# Patient Record
Sex: Female | Born: 1990 | Race: White | Hispanic: No | Marital: Single | Smoking: Never smoker
Health system: Southern US, Community
[De-identification: ages and names within clinical notes are randomized; demographics above are authoritative.]

## PROBLEM LIST (undated history)

## (undated) DIAGNOSIS — G039 Meningitis, unspecified: Secondary | ICD-10-CM

## (undated) DIAGNOSIS — R87629 Unspecified abnormal cytological findings in specimens from vagina: Secondary | ICD-10-CM

## (undated) DIAGNOSIS — I10 Essential (primary) hypertension: Secondary | ICD-10-CM

## (undated) DIAGNOSIS — O139 Gestational [pregnancy-induced] hypertension without significant proteinuria, unspecified trimester: Secondary | ICD-10-CM

## (undated) DIAGNOSIS — F32A Depression, unspecified: Secondary | ICD-10-CM

## (undated) DIAGNOSIS — F329 Major depressive disorder, single episode, unspecified: Secondary | ICD-10-CM

## (undated) HISTORY — PX: HIP SURGERY: SHX245

---
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
## 2014-10-20 ENCOUNTER — Inpatient Hospital Stay
Admit: 2014-10-20 | Discharge: 2014-10-20 | Disposition: A | Discharge status: Home / Self Care | Payer: Self-pay | Attending: Emergency Medicine

## 2014-10-20 DIAGNOSIS — N3000 Acute cystitis without hematuria: Secondary | ICD-10-CM

## 2014-10-20 LAB — URINALYSIS W/ RFLX MICROSCOPIC
Bilirubin: NEGATIVE
Glucose: NEGATIVE mg/dL
Ketone: NEGATIVE mg/dL
Nitrites: NEGATIVE
Protein: NEGATIVE mg/dL
Specific gravity: 1.02 (ref 1.003–1.030)
Urobilinogen: 0.2 EU/dL (ref 0.2–1.0)
pH (UA): 8 (ref 5.0–8.0)

## 2014-10-20 LAB — URINE MICROSCOPIC ONLY
RBC: NEGATIVE /hpf (ref 0–5)
WBC: 32 /hpf (ref 0–4)

## 2014-10-20 LAB — WET PREP

## 2014-10-20 LAB — HCG URINE, QL: HCG urine, QL: NEGATIVE

## 2014-10-20 MED ORDER — AZITHROMYCIN 250 MG TAB
250 mg | ORAL | Status: AC
Start: 2014-10-20 — End: 2014-10-20
  Administered 2014-10-20: 17:00:00 via ORAL

## 2014-10-20 MED ORDER — TRIMETHOPRIM-SULFAMETHOXAZOLE 160 MG-800 MG TAB
160-800 mg | ORAL_TABLET | Freq: Two times a day (BID) | ORAL | Status: AC
Start: 2014-10-20 — End: 2014-10-27

## 2014-10-20 MED ORDER — CEFTRIAXONE 250 MG SOLUTION FOR INJECTION
250 mg | INTRAMUSCULAR | Status: AC
Start: 2014-10-20 — End: 2014-10-20
  Administered 2014-10-20: 17:00:00 via INTRAMUSCULAR

## 2014-10-20 MED FILL — AZITHROMYCIN 250 MG TAB: 250 mg | ORAL | Qty: 4

## 2014-10-20 MED FILL — CEFTRIAXONE 250 MG SOLUTION FOR INJECTION: 250 mg | INTRAMUSCULAR | Qty: 250

## 2014-10-20 NOTE — ED Notes (Signed)
I performed a brief evaluation, including history and physical, of the patient here in triage and I have determined that pt will need further treatment and evaluation   I have placed initial orders to help in expediting patients care.     October 20, 2014 at 11:46 AM - Oneal Biglow, PA        There were no vitals taken for this visit.       Sore throat x 1 month, vomiting and diarrhea x this morning, vaginal discharge, pelvic pain, dysuria and urinary freq x 1 week

## 2014-10-20 NOTE — ED Provider Notes (Signed)
HPI Comments: 23 yo F c/o sore throat, vaginal discharge, and dysuria. Has had sore throat x 1 month, but 3 days ago noticed some white patches on her tonsils. She also c/o dysuria and vaginal discharge x 3 weeks. Denies fever, abdominal pain, n/v, cough, back pain. No other complaints.     Patient is a 23 y.o. female presenting with abdominal pain, sore throat, frequency, and other event.   Abdominal Pain   Associated symptoms include frequency.   Sore Throat     Urinary Frequency   Associated symptoms include frequency and abdominal pain.   Other  Associated symptoms include abdominal pain.                                    No past medical history on file.;     No past surgical history on file.      No family history on file.     History     Social History   ??? Marital Status: SINGLE     Spouse Name: N/A     Number of Children: N/A   ??? Years of Education: N/A     Occupational History   ??? Not on file.     Social History Main Topics   ??? Smoking status: Not on file   ??? Smokeless tobacco: Not on file   ??? Alcohol Use: Not on file   ??? Drug Use: Not on file   ??? Sexual Activity: Not on file     Other Topics Concern   ??? Not on file     Social History Narrative   ??? No narrative on file                  ALLERGIES: Review of patient's allergies indicates no known allergies.      Review of Systems   HENT: Positive for sore throat.    Gastrointestinal: Positive for abdominal pain.   Genitourinary: Positive for frequency.   All other systems reviewed and are negative.      Filed Vitals:    10/20/14 1152   BP: 154/96   Pulse: 93   Temp: 98.7 ??F (37.1 ??C)   Resp: 18   Height: 5\' 9"  (1.753 m)   Weight: 157.852 kg (348 lb)   SpO2: 98%            Physical Exam   Constitutional: She is oriented to person, place, and time. She appears well-developed and well-nourished. No distress.   Cardiovascular: Normal rate, regular rhythm and normal heart sounds.    Pulmonary/Chest: Effort normal and breath sounds normal. No respiratory  distress.   Abdominal: Soft. Normal appearance. There is tenderness in the suprapubic area.   Genitourinary: Uterus normal. There is no tenderness or lesion on the right labia. There is no tenderness or lesion on the left labia. Cervix exhibits no motion tenderness, no discharge and no friability. Right adnexum displays no tenderness and no fullness. Left adnexum displays no tenderness and no fullness. There is bleeding (mild) in the vagina. No vaginal discharge found.   RN present for exam   Musculoskeletal: Normal range of motion.   Neurological: She is alert and oriented to person, place, and time.   Skin: Skin is warm and dry.   Psychiatric: She has a normal mood and affect. Her behavior is normal.   Nursing note and vitals reviewed.  MDM  Number of Diagnoses or Management Options  Acute cystitis without hematuria: new and does not require workup  Screening examination for STD (sexually transmitted disease): new and does not require workup  Diagnosis management comments: Labs Reviewed  URINALYSIS W/ RFLX MICROSCOPIC - Abnormal; Notable for the following:      Blood                         TRACE (*)               Leukocyte Esterase            SMALL (*)            All other components within normal limits  URINE MICROSCOPIC ONLY - Abnormal; Notable for the following:      Bacteria                      1+ (*)              All other components within normal limits  CHLAMYDIA/GC AMPLIFIED  WET PREP  STREP THROAT SCREEN  HCG URINE, QL    Pt well appearing and in NAD. UA as above, will treat for UTI. Wet prep and Strep throat negative. GC/chlam pending. Pt would like abx ppx. Will d/h to f/u with PCP as needed. 1:18 PM        Amount and/or Complexity of Data Reviewed  Clinical lab tests: ordered and reviewed                            Procedures      -------------------------------------------------------------------------------------------------------------------     EKG INTERPRETATIONS:      RADIOLOGY RESULTS:    No orders to display       LABORATORY RESULTS:  Recent Results (from the past 12 hour(s))   URINALYSIS W/ RFLX MICROSCOPIC    Collection Time: 10/20/14 12:00 PM   Result Value Ref Range    Color YELLOW      Appearance CLEAR      Specific gravity 1.020 1.003 - 1.030      pH (UA) 8.0 5.0 - 8.0      Protein NEGATIVE  NEG mg/dL    Glucose NEGATIVE  NEG mg/dL    Ketone NEGATIVE  NEG mg/dL    Bilirubin NEGATIVE  NEG      Blood TRACE (A) NEG      Urobilinogen 0.2 0.2 - 1.0 EU/dL    Nitrites NEGATIVE  NEG      Leukocyte Esterase SMALL (A) NEG     HCG URINE, QL    Collection Time: 10/20/14 12:00 PM   Result Value Ref Range    HCG urine, Ql. NEGATIVE  NEG     URINE MICROSCOPIC ONLY    Collection Time: 10/20/14 12:00 PM   Result Value Ref Range    WBC 32 to 35 0 - 4 /hpf    RBC NEGATIVE  0 - 5 /hpf    Epithelial cells 2+ 0 - 5 /lpf    Bacteria 1+ (A) NEG /hpf   STREP THROAT SCREEN    Collection Time: 10/20/14 12:20 PM   Result Value Ref Range    Special Requests: NO SPECIAL REQUESTS      Strep Screen NEGATIVE       Culture result: PENDING    WET PREP    Collection Time: 10/20/14 12:30 PM  Result Value Ref Range    Special Requests: NO SPECIAL REQUESTS      Wet prep NO YEAST,TRICHOMONAS OR CLUE CELLS NOTED             CONSULTATIONS:        PROGRESS NOTES:        Lengthy D/W pt regarding possible worsening of pt's condition, need for follow up and strict ED return instructions for any worsening symptoms.     DISPOSITION:  ED DIAGNOSIS & DISPOSITION INFORMATION  Diagnosis:   1. Acute cystitis without hematuria    2. Screening examination for STD (sexually transmitted disease)          Disposition: home    Follow-up Information     Follow up With Details Comments Contact Info    [facility]  As needed [address]  [place] [id number]  [phone]    [facility]  Immediately if symptoms worsen [address]  [place] [id number]  [phone]           Current Discharge Medication List      START taking these medications    Details   trimethoprim-sulfamethoxazole (BACTRIM DS) 160-800 mg per tablet Take 1 Tab by mouth two (2) times a day for 7 days.  Qty: 14 Tab, Refills: 0

## 2014-10-20 NOTE — ED Notes (Signed)
Pt. C/o  Sore throat for  A month,   Lower abdominal pain, urinary frequency, vaginal discharge  For 3 weeks,   On & off diarrhea   For a month , nausea /vomiting today

## 2014-10-22 LAB — STREP THROAT SCREEN: Strep Screen: NEGATIVE

## 2014-10-23 LAB — CHLAMYDIA/GC PCR
Chlamydia amplified: NEGATIVE
N. gonorrhea, amplified: NEGATIVE

## 2015-04-17 NOTE — Progress Notes (Signed)
04/17/15:  Patient has been scheduled for Visit #1 on May 3 at 3:15 pm.    Adela PortsJacqueline Browning, MS RD

## 2015-04-24 ENCOUNTER — Inpatient Hospital Stay: Payer: Self-pay

## 2015-04-24 NOTE — Progress Notes (Signed)
04/24/15:  Patient did not show for her nutrition appointment.    Adela PortsJacqueline Browning, MS RD

## 2015-10-30 ENCOUNTER — Emergency Department: Admit: 2015-10-30 | Payer: PRIVATE HEALTH INSURANCE

## 2015-10-30 ENCOUNTER — Inpatient Hospital Stay
Admit: 2015-10-30 | Discharge: 2015-10-30 | Disposition: A | Discharge status: Home / Self Care | Payer: PRIVATE HEALTH INSURANCE | Attending: Emergency Medicine

## 2015-10-30 DIAGNOSIS — J069 Acute upper respiratory infection, unspecified: Secondary | ICD-10-CM

## 2015-10-30 MED ORDER — PREDNISONE 20 MG TAB
20 mg | ORAL_TABLET | Freq: Every day | ORAL | 0 refills | Status: AC
Start: 2015-10-30 — End: 2015-11-03

## 2015-10-30 MED ORDER — CODEINE-GUAIFENESIN 10 MG-100 MG/5 ML ORAL LIQUID
100-10 mg/5 mL | Freq: Four times a day (QID) | ORAL | 0 refills | Status: AC | PRN
Start: 2015-10-30 — End: ?

## 2015-10-30 MED ORDER — ALBUTEROL SULFATE HFA 90 MCG/ACTUATION AEROSOL INHALER
90 mcg/actuation | RESPIRATORY_TRACT | 0 refills | Status: AC | PRN
Start: 2015-10-30 — End: ?

## 2015-10-30 NOTE — ED Triage Notes (Signed)
Pt c/o cough and chest congestion on and off  x 1 month

## 2015-10-30 NOTE — ED Notes (Signed)
I have reviewed discharge instructions with the patient.  The patient verbalized understanding.    Medication teaching given, to include name, dose, action, and side effects. Patient verbalized understanding of medications. Encouraged patient to voice any concerns with reassurance provided.  Instructed not to drive or use heavy machinery while taking this medication. Patient states they have someone to drive them home.     Patient armband removed and shredded    Patient Discharged in stable condition.  Patient is awake, alert and oriented x 4.

## 2015-10-30 NOTE — ED Provider Notes (Signed)
HPI Comments: Pt c/o cough, off and on for one month, w occas pain under her breast, esp w coughing no sob. No abd pain. No vomiting no fever.  No weakness. No sore throat.  No urinary sx's or chance of curr preg.     Patient is a 24 y.o. female presenting with cough.   Cough   Associated symptoms include wheezing. Pertinent negatives include no chest pain and no vomiting.        History reviewed. No pertinent past medical history.    History reviewed. No pertinent past surgical history.      History reviewed. No pertinent family history.    Social History     Social History   ??? Marital status: SINGLE     Spouse name: N/A   ??? Number of children: N/A   ??? Years of education: N/A     Occupational History   ??? Not on file.     Social History Main Topics   ??? Smoking status: Not on file   ??? Smokeless tobacco: Not on file   ??? Alcohol use Not on file   ??? Drug use: Not on file   ??? Sexual activity: Not on file     Other Topics Concern   ??? Not on file     Social History Narrative   ??? No narrative on file         ALLERGIES: Review of patient's allergies indicates no known allergies.    Review of Systems   Constitutional: Negative for fever.   HENT: Negative for congestion.    Respiratory: Positive for cough and wheezing.    Cardiovascular: Negative for chest pain.   Gastrointestinal: Negative for abdominal pain and vomiting.   Musculoskeletal: Negative for back pain.   Skin: Negative for rash.   Neurological: Negative for light-headedness.   All other systems reviewed and are negative.      Vitals:    10/30/15 0031 10/30/15 0208   BP:  108/57   Resp: 21    Temp: 98.4 ??F (36.9 ??C)    SpO2: 98% 99%   Weight: 156.5 kg (345 lb)    Height:  (1.753 m)             Physical Exam   Constitutional: She is oriented to person, place, and time. She appears well-developed.   HENT:   Head: Normocephalic.   Mouth/Throat: Oropharynx is clear and moist.   Eyes: Pupils are equal, round, and reactive to light.   Neck: Normal range of motion.    Cardiovascular: Normal rate and normal heart sounds.    No murmur heard.  Pulmonary/Chest: Effort normal. She has wheezes (rare exp). She has no rales.   Abdominal: Soft. There is no tenderness.   Musculoskeletal: Normal range of motion. She exhibits no edema.   Neurological: She is alert and oriented to person, place, and time.   Skin: Skin is warm and dry.   Nursing note and vitals reviewed.       MDM  ED Course       Procedures        Vitals:  Patient Vitals for the past 12 hrs:   Temp Resp BP SpO2   10/30/15 0208 - - 108/57 99 %   10/30/15 0031 98.4 ??F (36.9 ??C) 21 - 98 %         Medications ordered:   Medications - No data to display      Lab findings:  No results  found for this or any previous visit (from the past 12 hour(s)).        X-Ray, CT or other radiology findings or impressions:  XR CHEST PA LAT   Final Result          Progress notes, Consult notes or additional Procedure notes:     2:09 AM pt refuses ekg as recommended, wants dc. + bronchospastic cough.  Af, nl vitals including nl sats. Neg cxr.  Stable for dc and close f/u.  Detailed return instr given.     Disposition:  Diagnosis:   1. Acute upper respiratory infection    2. Acute bronchospasm        Disposition: home    Follow-up Information     Follow up With Details Comments Contact Info    [facility] Schedule an appointment as soon as possible for a visit in 1 day  [address]  [address]  [place] [id number]  [phone]           Discharge Medication List as of 10/30/2015  2:05 AM      START taking these medications    Details   albuterol (PROVENTIL HFA, VENTOLIN HFA, PROAIR HFA) 90 mcg/actuation inhaler Take 1-2 Puffs by inhalation every four (4) hours as needed for Wheezing., Print, Disp-1 Inhaler, R-0      predniSONE (DELTASONE) 20 mg tablet Take 3 Tabs by mouth daily for 4 days., Print, Disp-12 Tab, R-0      guaiFENesin-codeine (ROBITUSSIN AC) 100-10 mg/5 mL solution Take 5-10 mL  by mouth every six (6) hours as needed for Cough. Max Daily Amount: 40 mL., Print, Disp-125 mL, R-0

## 2017-08-06 HISTORY — PX: COLPOSCOPY: SHX161

## 2017-09-07 LAB — OB RESULTS CONSOLE ABO/RH: RH TYPE: POSITIVE

## 2017-09-07 LAB — OB RESULTS CONSOLE ANTIBODY SCREEN: ANTIBODY SCREEN: NEGATIVE

## 2017-09-07 LAB — OB RESULTS CONSOLE GC/CHLAMYDIA
CHLAMYDIA, DNA PROBE: NEGATIVE
GC PROBE AMP, GENITAL: NEGATIVE

## 2017-09-07 LAB — OB RESULTS CONSOLE RUBELLA ANTIBODY, IGM: Rubella: IMMUNE

## 2017-09-07 LAB — OB RESULTS CONSOLE RPR: RPR: NONREACTIVE

## 2017-09-07 LAB — OB RESULTS CONSOLE HIV ANTIBODY (ROUTINE TESTING): HIV: NONREACTIVE

## 2017-09-07 LAB — OB RESULTS CONSOLE HEPATITIS B SURFACE ANTIGEN: HEP B S AG: NEGATIVE

## 2017-10-05 ENCOUNTER — Other Ambulatory Visit (HOSPITAL_COMMUNITY): Payer: Self-pay | Admitting: Obstetrics and Gynecology

## 2017-10-05 DIAGNOSIS — Z8279 Family history of other congenital malformations, deformations and chromosomal abnormalities: Secondary | ICD-10-CM

## 2017-10-05 DIAGNOSIS — Z3689 Encounter for other specified antenatal screening: Secondary | ICD-10-CM

## 2017-10-05 DIAGNOSIS — Z3A22 22 weeks gestation of pregnancy: Secondary | ICD-10-CM

## 2017-10-07 ENCOUNTER — Encounter (HOSPITAL_COMMUNITY): Payer: Self-pay | Admitting: Obstetrics and Gynecology

## 2017-10-13 ENCOUNTER — Encounter (HOSPITAL_COMMUNITY): Payer: Self-pay | Admitting: *Deleted

## 2017-10-14 ENCOUNTER — Ambulatory Visit (HOSPITAL_COMMUNITY)
Admission: RE | Admit: 2017-10-14 | Discharge: 2017-10-14 | Disposition: A | Discharge status: Home / Self Care | Payer: Medicaid Other | Source: Ambulatory Visit | Attending: Obstetrics and Gynecology | Admitting: Obstetrics and Gynecology

## 2017-10-14 ENCOUNTER — Encounter (HOSPITAL_COMMUNITY): Payer: Self-pay

## 2017-10-14 DIAGNOSIS — O99212 Obesity complicating pregnancy, second trimester: Secondary | ICD-10-CM | POA: Insufficient documentation

## 2017-10-14 DIAGNOSIS — Z368A Encounter for antenatal screening for other genetic defects: Secondary | ICD-10-CM | POA: Diagnosis not present

## 2017-10-14 DIAGNOSIS — Z363 Encounter for antenatal screening for malformations: Secondary | ICD-10-CM | POA: Diagnosis not present

## 2017-10-14 DIAGNOSIS — Z8279 Family history of other congenital malformations, deformations and chromosomal abnormalities: Secondary | ICD-10-CM | POA: Insufficient documentation

## 2017-10-14 DIAGNOSIS — Z3689 Encounter for other specified antenatal screening: Secondary | ICD-10-CM | POA: Diagnosis present

## 2017-10-14 DIAGNOSIS — Z3A22 22 weeks gestation of pregnancy: Secondary | ICD-10-CM | POA: Insufficient documentation

## 2017-10-14 DIAGNOSIS — Z6841 Body Mass Index (BMI) 40.0 and over, adult: Secondary | ICD-10-CM | POA: Insufficient documentation

## 2017-10-14 HISTORY — DX: Unspecified abnormal cytological findings in specimens from vagina: R87.629

## 2018-01-13 ENCOUNTER — Telehealth (HOSPITAL_COMMUNITY): Payer: Self-pay | Admitting: *Deleted

## 2018-01-13 ENCOUNTER — Other Ambulatory Visit: Payer: Self-pay | Admitting: Obstetrics and Gynecology

## 2018-01-13 ENCOUNTER — Encounter (HOSPITAL_COMMUNITY): Payer: Self-pay | Admitting: *Deleted

## 2018-01-13 NOTE — Telephone Encounter (Signed)
Preadmission screen  

## 2018-01-15 ENCOUNTER — Institutional Professional Consult (permissible substitution): Payer: Self-pay | Admitting: Pediatrics

## 2018-01-27 ENCOUNTER — Inpatient Hospital Stay (HOSPITAL_COMMUNITY)
Admission: RE | Admit: 2018-01-27 | Discharge: 2018-01-31 | DRG: 786 | Disposition: A | Discharge status: Home / Self Care | Payer: Medicaid Other | Source: Ambulatory Visit | Attending: Obstetrics and Gynecology | Admitting: Obstetrics and Gynecology

## 2018-01-27 ENCOUNTER — Encounter (HOSPITAL_COMMUNITY): Payer: Self-pay

## 2018-01-27 VITALS — BP 126/72 | HR 93 | Temp 98.3°F | Resp 18 | Ht 69.0 in | Wt 356.0 lb

## 2018-01-27 DIAGNOSIS — O134 Gestational [pregnancy-induced] hypertension without significant proteinuria, complicating childbirth: Principal | ICD-10-CM | POA: Diagnosis present

## 2018-01-27 DIAGNOSIS — O1490 Unspecified pre-eclampsia, unspecified trimester: Secondary | ICD-10-CM | POA: Diagnosis present

## 2018-01-27 DIAGNOSIS — O99214 Obesity complicating childbirth: Secondary | ICD-10-CM | POA: Diagnosis present

## 2018-01-27 DIAGNOSIS — Z3490 Encounter for supervision of normal pregnancy, unspecified, unspecified trimester: Secondary | ICD-10-CM

## 2018-01-27 DIAGNOSIS — O411230 Chorioamnionitis, third trimester, not applicable or unspecified: Secondary | ICD-10-CM | POA: Diagnosis present

## 2018-01-27 DIAGNOSIS — Z3A37 37 weeks gestation of pregnancy: Secondary | ICD-10-CM

## 2018-01-27 DIAGNOSIS — O34219 Maternal care for unspecified type scar from previous cesarean delivery: Secondary | ICD-10-CM

## 2018-01-27 HISTORY — DX: Depression, unspecified: F32.A

## 2018-01-27 HISTORY — DX: Gestational (pregnancy-induced) hypertension without significant proteinuria, unspecified trimester: O13.9

## 2018-01-27 HISTORY — DX: Meningitis, unspecified: G03.9

## 2018-01-27 HISTORY — DX: Major depressive disorder, single episode, unspecified: F32.9

## 2018-01-27 HISTORY — DX: Essential (primary) hypertension: I10

## 2018-01-27 LAB — COMPREHENSIVE METABOLIC PANEL
ALBUMIN: 2.6 g/dL — AB (ref 3.5–5.0)
ALT: 15 U/L (ref 14–54)
AST: 20 U/L (ref 15–41)
Alkaline Phosphatase: 109 U/L (ref 38–126)
Anion gap: 9 (ref 5–15)
BILIRUBIN TOTAL: 0.3 mg/dL (ref 0.3–1.2)
BUN: 10 mg/dL (ref 6–20)
CHLORIDE: 106 mmol/L (ref 101–111)
CO2: 17 mmol/L — ABNORMAL LOW (ref 22–32)
Calcium: 8.1 mg/dL — ABNORMAL LOW (ref 8.9–10.3)
Creatinine, Ser: 0.49 mg/dL (ref 0.44–1.00)
GFR calc Af Amer: >60 mL/min (ref >60)
GFR calc non Af Amer: >60 mL/min (ref >60)
GLUCOSE: 110 mg/dL — AB (ref 65–99)
POTASSIUM: 3.9 mmol/L (ref 3.5–5.1)
Sodium: 132 mmol/L — ABNORMAL LOW (ref 135–145)
Total Protein: 5.6 g/dL — ABNORMAL LOW (ref 6.5–8.1)

## 2018-01-27 LAB — CBC
HCT: 36.1 % (ref 36.0–46.0)
HEMATOCRIT: 35 % — AB (ref 36.0–46.0)
HEMOGLOBIN: 12.1 g/dL (ref 12.0–15.0)
Hemoglobin: 11.5 g/dL — ABNORMAL LOW (ref 12.0–15.0)
MCH: 27.9 pg (ref 26.0–34.0)
MCH: 28.5 pg (ref 26.0–34.0)
MCHC: 32.9 g/dL (ref 30.0–36.0)
MCHC: 33.5 g/dL (ref 30.0–36.0)
MCV: 85 fL (ref 78.0–100.0)
MCV: 85.1 fL (ref 78.0–100.0)
Platelets: 196 10*3/uL (ref 150–400)
Platelets: 198 10*3/uL (ref 150–400)
RBC: 4.12 MIL/uL (ref 3.87–5.11)
RBC: 4.24 MIL/uL (ref 3.87–5.11)
RDW: 13.3 % (ref 11.5–15.5)
RDW: 13.4 % (ref 11.5–15.5)
WBC: 8.1 10*3/uL (ref 4.0–10.5)
WBC: 12.2 10*3/uL — ABNORMAL HIGH (ref 4.0–10.5)

## 2018-01-27 LAB — TYPE AND SCREEN
ABO/RH(D): O POS
ANTIBODY SCREEN: NEGATIVE

## 2018-01-27 LAB — ABO/RH: ABO/RH(D): O POS

## 2018-01-27 LAB — OB RESULTS CONSOLE GBS: GBS: NEGATIVE

## 2018-01-27 MED ORDER — TERBUTALINE SULFATE 1 MG/ML IJ SOLN: 0.2500 mg | Freq: Once | INTRAMUSCULAR | Status: DC | PRN

## 2018-01-27 MED ORDER — LACTATED RINGERS IV SOLN
INTRAVENOUS | Status: DC
  Administered 2018-01-27: via INTRAVENOUS
  Administered 2018-01-27 (×2): 1000 mL via INTRAVENOUS
  Administered 2018-01-28: 04:00:00 via INTRAVENOUS
  Administered 2018-01-28: 125 mL/h via INTRAVENOUS

## 2018-01-27 MED ORDER — OXYTOCIN 40 UNITS IN LACTATED RINGERS INFUSION - SIMPLE MED: 2.5000 [IU]/h | INTRAVENOUS | Status: DC

## 2018-01-27 MED ORDER — SOD CITRATE-CITRIC ACID 500-334 MG/5ML PO SOLN
30.0000 mL | ORAL | Status: DC | PRN
  Administered 2018-01-28: 30 mL via ORAL
  Filled 2018-01-27: qty 15

## 2018-01-27 MED ORDER — LACTATED RINGERS IV SOLN: 500.0000 mL | INTRAVENOUS | Status: DC | PRN

## 2018-01-27 MED ORDER — OXYTOCIN 40 UNITS IN LACTATED RINGERS INFUSION - SIMPLE MED
1.0000 m[IU]/min | INTRAVENOUS | Status: DC
  Administered 2018-01-27 (×2): 2 m[IU]/min via INTRAVENOUS
  Filled 2018-01-27: qty 1000

## 2018-01-27 MED ORDER — OXYTOCIN BOLUS FROM INFUSION: 500.0000 mL | Freq: Once | INTRAVENOUS | Status: DC

## 2018-01-27 MED ORDER — ONDANSETRON HCL 4 MG/2ML IJ SOLN
4.0000 mg | Freq: Four times a day (QID) | INTRAMUSCULAR | Status: DC | PRN
  Administered 2018-01-27: 4 mg via INTRAVENOUS
  Filled 2018-01-27: qty 2

## 2018-01-27 MED ORDER — LIDOCAINE HCL (PF) 1 % IJ SOLN: 30.0000 mL | INTRAMUSCULAR | Status: DC | PRN

## 2018-01-27 MED ORDER — ACETAMINOPHEN 325 MG PO TABS: 650.0000 mg | ORAL_TABLET | ORAL | Status: DC | PRN

## 2018-01-27 NOTE — Progress Notes (Signed)
This note also relates to the following rows which could not be included: BP - Cannot attach notes to unvalidated device data Pulse Rate - Cannot attach notes to unvalidated device data  IFSE's not working  Unable to monitor adequately. Belly band used helps.

## 2018-01-27 NOTE — Anesthesia Pain Management Evaluation Note (Signed)
  CRNA Pain Management Visit Note  Patient: Anita Rojas, 27 y.o., female  "Hello I am a member of the anesthesia team [facility]. We have an anesthesia team available at all times to provide care throughout the hospital, including epidural management and anesthesia for C-section. I don't know your plan for the delivery whether it a natural birth, water birth, IV sedation, nitrous supplementation, doula or epidural, but we want to meet your pain goals."   1.Was your pain managed to your expectations on prior hospitalizations?   No prior hospitalizations  2.What is your expectation for pain management during this hospitalization?     Epidural and Nitrous Oxide  3.How can we help you reach that goal? Provide support  Record the patient's initial score and the patient's pain goal.   Pain: 1  Pain Goal: 5 [facility] wants you to be able to say your pain was always managed very well.  Sherrie GeorgeStephanie W [facility] 01/27/2018

## 2018-01-27 NOTE — H&P (Signed)
27 y.o. 3016w2d  G1P0 comes in for induction [facility].  Otherwise has good fetal movement and no bleeding.  Past Medical History:  Diagnosis Date  . Vaginal Pap smear, abnormal     Past Surgical History:  Procedure Laterality Date  . 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 COLPOSCOPY  08/06/2017  . HIP SURGERY      OB History  Gravida Para Term Preterm AB Living  1            SAB TAB Ectopic Multiple Live Births               # Outcome Date GA Lbr Len/2nd Weight Sex Delivery Anes PTL Lv  1 Current               Social History   Socioeconomic History  . Marital status: Single    Spouse name: Not on file  . Number of children: Not on file  . Years of education: Not on file  . Highest education level: Not on file  Social Needs  . Financial resource strain: Not on file  . Food insecurity - worry: Not on file  . Food insecurity - inability: Not on file  . Transportation needs - medical: Not on file  . Transportation needs - non-medical: Not on file  Occupational History  . Not on file  Tobacco Use  . Smoking status: Never Smoker  . Smokeless tobacco: Never Used  Substance and Sexual Activity  . Alcohol use: No  . Drug use: No  . Sexual activity: Not on file  Other Topics Concern  . Not on file  Social History Narrative  . Not on file   Patient has no known allergies.    Prenatal Transfer Tool  Maternal Diabetes: No Genetic Screening: Normal Maternal Ultrasounds/Referrals: Normal Fetal Ultrasounds or other Referrals:  None Maternal Substance Abuse:  No Significant Maternal Medications:  None Significant Maternal Lab Results: None  Other PNC: uncomplicated.    There were no vitals filed for this visit.  Lungs/Cor:  NAD Abdomen:  soft, gravid Ex:  no cords, erythema SVE:  3/50/-2,AROM clear FHTs:  120s, good STV, NST R Toco:  q occ   A/P   GHTN term induction.  GBS neg.  Dionna Wiedemann A

## 2018-01-28 ENCOUNTER — Encounter (HOSPITAL_COMMUNITY)
Admission: RE | Disposition: A | Discharge status: Home / Self Care | Payer: Self-pay | Source: Ambulatory Visit | Attending: Obstetrics and Gynecology

## 2018-01-28 ENCOUNTER — Encounter (HOSPITAL_COMMUNITY): Payer: Self-pay

## 2018-01-28 ENCOUNTER — Inpatient Hospital Stay (HOSPITAL_COMMUNITY): Payer: Medicaid Other | Admitting: Anesthesiology

## 2018-01-28 DIAGNOSIS — Z3490 Encounter for supervision of normal pregnancy, unspecified, unspecified trimester: Secondary | ICD-10-CM

## 2018-01-28 LAB — CBC
HCT: 33.5 % — ABNORMAL LOW (ref 36.0–46.0)
Hemoglobin: 11.2 g/dL — ABNORMAL LOW (ref 12.0–15.0)
MCH: 28.2 pg (ref 26.0–34.0)
MCHC: 33.4 g/dL (ref 30.0–36.0)
MCV: 84.4 fL (ref 78.0–100.0)
Platelets: 169 10*3/uL (ref 150–400)
RBC: 3.97 MIL/uL (ref 3.87–5.11)
RDW: 13 % (ref 11.5–15.5)
WBC: 16.4 10*3/uL — ABNORMAL HIGH (ref 4.0–10.5)

## 2018-01-28 LAB — SYPHILIS: RPR W/REFLEX TO RPR TITER AND TREPONEMAL ANTIBODIES, TRADITIONAL SCREENING AND DIAGNOSIS ALGORITHM: RPR Ser Ql: NONREACTIVE

## 2018-01-28 SURGERY — Surgical Case
Anesthesia: Epidural | Wound class: Clean Contaminated

## 2018-01-28 MED ORDER — SODIUM CHLORIDE 0.9 % IV SOLN
2.0000 g | Freq: Once | INTRAVENOUS | Status: AC
  Administered 2018-01-28: 2 g via INTRAVENOUS
  Filled 2018-01-28: qty 2000

## 2018-01-28 MED ORDER — OXYTOCIN 40 UNITS IN LACTATED RINGERS INFUSION - SIMPLE MED: 2.5000 [IU]/h | INTRAVENOUS | Status: AC

## 2018-01-28 MED ORDER — METOCLOPRAMIDE HCL 5 MG/ML IJ SOLN
INTRAMUSCULAR | Status: AC
  Filled 2018-01-28: qty 2

## 2018-01-28 MED ORDER — SCOPOLAMINE 1 MG/3DAYS TD PT72: 1.0000 | MEDICATED_PATCH | Freq: Once | TRANSDERMAL | Status: DC

## 2018-01-28 MED ORDER — DEXAMETHASONE SODIUM PHOSPHATE 4 MG/ML IJ SOLN
INTRAMUSCULAR | Status: AC
  Filled 2018-01-28: qty 1

## 2018-01-28 MED ORDER — MEPERIDINE HCL 25 MG/ML IJ SOLN: 6.2500 mg | INTRAMUSCULAR | Status: DC | PRN

## 2018-01-28 MED ORDER — PHENYLEPHRINE 40 MCG/ML (10ML) SYRINGE FOR IV PUSH (FOR BLOOD PRESSURE SUPPORT): 80.0000 ug | PREFILLED_SYRINGE | INTRAVENOUS | Status: DC | PRN

## 2018-01-28 MED ORDER — LIDOCAINE-EPINEPHRINE (PF) 2 %-1:200000 IJ SOLN
INTRAMUSCULAR | Status: AC
Start: 2018-01-28 — End: 2018-01-28
  Filled 2018-01-28: qty 20

## 2018-01-28 MED ORDER — LACTATED RINGERS IV SOLN
INTRAVENOUS | Status: DC | PRN
  Administered 2018-01-28 (×3): via INTRAVENOUS

## 2018-01-28 MED ORDER — WITCH HAZEL-GLYCERIN EX PADS: 1.0000 "application " | MEDICATED_PAD | CUTANEOUS | Status: DC | PRN

## 2018-01-28 MED ORDER — ZOLPIDEM TARTRATE 5 MG PO TABS: 5.0000 mg | ORAL_TABLET | Freq: Every evening | ORAL | Status: DC | PRN

## 2018-01-28 MED ORDER — IBUPROFEN 600 MG PO TABS
600.0000 mg | ORAL_TABLET | Freq: Four times a day (QID) | ORAL | Status: DC
  Administered 2018-01-28 – 2018-01-31 (×11): 600 mg via ORAL
  Filled 2018-01-28 (×12): qty 1

## 2018-01-28 MED ORDER — DEXAMETHASONE SODIUM PHOSPHATE 4 MG/ML IJ SOLN
INTRAMUSCULAR | Status: DC | PRN
  Administered 2018-01-28: 4 mg via INTRAVENOUS

## 2018-01-28 MED ORDER — NALBUPHINE HCL 10 MG/ML IJ SOLN: 5.0000 mg | INTRAMUSCULAR | Status: DC | PRN

## 2018-01-28 MED ORDER — DEXTROSE 5 % IV SOLN
INTRAVENOUS | Status: DC | PRN
  Administered 2018-01-28: 3 g via INTRAVENOUS

## 2018-01-28 MED ORDER — SCOPOLAMINE 1 MG/3DAYS TD PT72
MEDICATED_PATCH | TRANSDERMAL | Status: AC
  Filled 2018-01-28: qty 1

## 2018-01-28 MED ORDER — PHENYLEPHRINE 40 MCG/ML (10ML) SYRINGE FOR IV PUSH (FOR BLOOD PRESSURE SUPPORT)
PREFILLED_SYRINGE | INTRAVENOUS | Status: AC
  Filled 2018-01-28: qty 10

## 2018-01-28 MED ORDER — DIBUCAINE 1 % RE OINT: 1.0000 "application " | TOPICAL_OINTMENT | RECTAL | Status: DC | PRN

## 2018-01-28 MED ORDER — FENTANYL CITRATE (PF) 100 MCG/2ML IJ SOLN: 25.0000 ug | INTRAMUSCULAR | Status: DC | PRN

## 2018-01-28 MED ORDER — PHENYLEPHRINE 40 MCG/ML (10ML) SYRINGE FOR IV PUSH (FOR BLOOD PRESSURE SUPPORT)
80.0000 ug | PREFILLED_SYRINGE | INTRAVENOUS | Status: DC | PRN
  Filled 2018-01-28: qty 10

## 2018-01-28 MED ORDER — PHENYLEPHRINE HCL 10 MG/ML IJ SOLN
INTRAMUSCULAR | Status: DC | PRN
  Administered 2018-01-28: 40 ug via INTRAVENOUS
  Administered 2018-01-28: 80 ug via INTRAVENOUS

## 2018-01-28 MED ORDER — SODIUM BICARBONATE 8.4 % IV SOLN
INTRAVENOUS | Status: AC
  Filled 2018-01-28: qty 50

## 2018-01-28 MED ORDER — MEASLES, MUMPS & RUBELLA VAC ~~LOC~~ INJ
0.5000 mL | INJECTION | Freq: Once | SUBCUTANEOUS | Status: DC
  Filled 2018-01-28: qty 0.5

## 2018-01-28 MED ORDER — ACETAMINOPHEN 325 MG PO TABS
650.0000 mg | ORAL_TABLET | ORAL | Status: DC | PRN
  Administered 2018-01-28 – 2018-01-29 (×2): 650 mg via ORAL
  Administered 2018-01-31: 325 mg via ORAL
  Filled 2018-01-28 (×4): qty 2

## 2018-01-28 MED ORDER — LIDOCAINE HCL (PF) 1 % IJ SOLN
INTRAMUSCULAR | Status: DC | PRN
  Administered 2018-01-28 (×2): 4 mL via EPIDURAL

## 2018-01-28 MED ORDER — EPHEDRINE 5 MG/ML INJ: 10.0000 mg | INTRAVENOUS | Status: DC | PRN

## 2018-01-28 MED ORDER — COCONUT OIL OIL
1.0000 "application " | TOPICAL_OIL | Status: DC | PRN
  Administered 2018-01-28: 1 via TOPICAL
  Filled 2018-01-28: qty 120

## 2018-01-28 MED ORDER — SIMETHICONE 80 MG PO CHEW
80.0000 mg | CHEWABLE_TABLET | ORAL | Status: DC
  Administered 2018-01-29 – 2018-01-30 (×2): 80 mg via ORAL
  Filled 2018-01-28 (×3): qty 1

## 2018-01-28 MED ORDER — DEXTROSE 5 % IV SOLN
1.0000 ug/kg/h | INTRAVENOUS | Status: DC | PRN
  Filled 2018-01-28: qty 5

## 2018-01-28 MED ORDER — LACTATED RINGERS IV SOLN
500.0000 mL | Freq: Once | INTRAVENOUS | Status: AC
  Administered 2018-01-28: 500 mL via INTRAVENOUS

## 2018-01-28 MED ORDER — ONDANSETRON HCL 4 MG/2ML IJ SOLN
INTRAMUSCULAR | Status: DC | PRN
  Administered 2018-01-28: 4 mg via INTRAVENOUS

## 2018-01-28 MED ORDER — SODIUM CHLORIDE 0.9% FLUSH: 3.0000 mL | INTRAVENOUS | Status: DC | PRN

## 2018-01-28 MED ORDER — NALBUPHINE HCL 10 MG/ML IJ SOLN: 5.0000 mg | Freq: Once | INTRAMUSCULAR | Status: DC | PRN

## 2018-01-28 MED ORDER — PRENATAL MULTIVITAMIN CH
1.0000 | ORAL_TABLET | Freq: Every day | ORAL | Status: DC
  Administered 2018-01-29 – 2018-01-30 (×2): 1 via ORAL
  Filled 2018-01-28 (×2): qty 1

## 2018-01-28 MED ORDER — METOCLOPRAMIDE HCL 5 MG/ML IJ SOLN
INTRAMUSCULAR | Status: DC | PRN
  Administered 2018-01-28: 10 mg via INTRAVENOUS

## 2018-01-28 MED ORDER — MORPHINE SULFATE (PF) 0.5 MG/ML IJ SOLN
INTRAMUSCULAR | Status: DC | PRN
  Administered 2018-01-28: 4 mg via EPIDURAL

## 2018-01-28 MED ORDER — LACTATED RINGERS IV SOLN
INTRAVENOUS | Status: DC | PRN
  Administered 2018-01-28: 12:00:00 via INTRAVENOUS

## 2018-01-28 MED ORDER — DEXTROSE 5 % IV SOLN
INTRAVENOUS | Status: AC
  Filled 2018-01-28: qty 3000

## 2018-01-28 MED ORDER — SENNOSIDES-DOCUSATE SODIUM 8.6-50 MG PO TABS
2.0000 | ORAL_TABLET | ORAL | Status: DC
  Administered 2018-01-29 – 2018-01-30 (×3): 2 via ORAL
  Filled 2018-01-28 (×3): qty 2

## 2018-01-28 MED ORDER — OXYTOCIN 10 UNIT/ML IJ SOLN
INTRAVENOUS | Status: DC | PRN
  Administered 2018-01-28: 40 [IU] via INTRAVENOUS

## 2018-01-28 MED ORDER — TETANUS-DIPHTH-ACELL PERTUSSIS 5-2.5-18.5 LF-MCG/0.5 IM SUSP: 0.5000 mL | Freq: Once | INTRAMUSCULAR | Status: DC

## 2018-01-28 MED ORDER — LACTATED RINGERS IV SOLN
INTRAVENOUS | Status: DC
  Administered 2018-01-28: 22:00:00 via INTRAVENOUS

## 2018-01-28 MED ORDER — OXYTOCIN 10 UNIT/ML IJ SOLN
INTRAMUSCULAR | Status: AC
  Filled 2018-01-28: qty 4

## 2018-01-28 MED ORDER — FENTANYL 2.5 MCG/ML BUPIVACAINE 1/10 % EPIDURAL INFUSION (WH - ANES)
14.0000 mL/h | INTRAMUSCULAR | Status: DC | PRN
  Administered 2018-01-28 (×2): 14 mL/h via EPIDURAL
  Filled 2018-01-28 (×2): qty 100

## 2018-01-28 MED ORDER — LIDOCAINE-EPINEPHRINE (PF) 2 %-1:200000 IJ SOLN
INTRAMUSCULAR | Status: AC
  Filled 2018-01-28: qty 40

## 2018-01-28 MED ORDER — MORPHINE SULFATE (PF) 0.5 MG/ML IJ SOLN
INTRAMUSCULAR | Status: AC
  Filled 2018-01-28: qty 10

## 2018-01-28 MED ORDER — NALOXONE HCL 0.4 MG/ML IJ SOLN: 0.4000 mg | INTRAMUSCULAR | Status: DC | PRN

## 2018-01-28 MED ORDER — DIPHENHYDRAMINE HCL 50 MG/ML IJ SOLN: 12.5000 mg | INTRAMUSCULAR | Status: DC | PRN

## 2018-01-28 MED ORDER — ONDANSETRON HCL 4 MG/2ML IJ SOLN
INTRAMUSCULAR | Status: AC
  Filled 2018-01-28: qty 2

## 2018-01-28 MED ORDER — SODIUM BICARBONATE 8.4 % IV SOLN
INTRAVENOUS | Status: DC | PRN
  Administered 2018-01-28: 5 mL via EPIDURAL
  Administered 2018-01-28: 8 mL via EPIDURAL
  Administered 2018-01-28: 2 mL via EPIDURAL
  Administered 2018-01-28: 5 mL via EPIDURAL
  Administered 2018-01-28 (×2): 3 mL via EPIDURAL
  Administered 2018-01-28: 4 mL via EPIDURAL

## 2018-01-28 MED ORDER — DIPHENHYDRAMINE HCL 25 MG PO CAPS
25.0000 mg | ORAL_CAPSULE | ORAL | Status: DC | PRN
  Filled 2018-01-28: qty 1

## 2018-01-28 MED ORDER — EPHEDRINE 5 MG/ML INJ
10.0000 mg | INTRAVENOUS | Status: DC | PRN
Start: 2018-01-28 — End: 2018-01-28

## 2018-01-28 MED ORDER — ONDANSETRON HCL 4 MG/2ML IJ SOLN: 4.0000 mg | Freq: Three times a day (TID) | INTRAMUSCULAR | Status: DC | PRN

## 2018-01-28 SURGICAL SUPPLY — 30 items
BENZOIN TINCTURE PRP APPL 2/3 (GAUZE/BANDAGES/DRESSINGS) ×6 IMPLANT
CHLORAPREP W/TINT 26ML (MISCELLANEOUS) ×3 IMPLANT
CLAMP CORD UMBIL (MISCELLANEOUS) IMPLANT
CLOSURE WOUND 1/2 X4 (GAUZE/BANDAGES/DRESSINGS) ×1
CLOTH BEACON ORANGE TIMEOUT ST (SAFETY) ×3 IMPLANT
DRESSING DISP NPWT PICO 4X12 (MISCELLANEOUS) ×3 IMPLANT
DRSG OPSITE POSTOP 4X10 (GAUZE/BANDAGES/DRESSINGS) ×3 IMPLANT
ELECT REM PT RETURN 9FT ADLT (ELECTROSURGICAL) ×3
ELECTRODE REM PT RTRN 9FT ADLT (ELECTROSURGICAL) ×1 IMPLANT
EXTRACTOR VACUUM M CUP 4 TUBE (SUCTIONS) IMPLANT
EXTRACTOR VACUUM M CUP 4' TUBE (SUCTIONS)
GLOVE BIOGEL PI IND STRL 7.0 (GLOVE) ×1 IMPLANT
GLOVE BIOGEL PI INDICATOR 7.0 (GLOVE) ×2
GLOVE ECLIPSE 7.0 STRL STRAW (GLOVE) ×6 IMPLANT
GOWN STRL REUS W/TWL LRG LVL3 (GOWN DISPOSABLE) ×6 IMPLANT
KIT ABG SYR 3ML LUER SLIP (SYRINGE) IMPLANT
NEEDLE HYPO 25X5/8 SAFETYGLIDE (NEEDLE) IMPLANT
NS IRRIG 1000ML POUR BTL (IV SOLUTION) ×3 IMPLANT
PACK C SECTION WH (CUSTOM PROCEDURE TRAY) ×3 IMPLANT
PAD OB MATERNITY 4.3X12.25 (PERSONAL CARE ITEMS) ×3 IMPLANT
RETAINER VISCERAL (MISCELLANEOUS) ×3 IMPLANT
RTRCTR C-SECT PINK 34CM XLRG (MISCELLANEOUS) ×3 IMPLANT
STRIP CLOSURE SKIN 1/2X4 (GAUZE/BANDAGES/DRESSINGS) ×2 IMPLANT
SUT MNCRL 0 VIOLET CTX 36 (SUTURE) ×5 IMPLANT
SUT MON AB 2-0 CT1 27 (SUTURE) ×6 IMPLANT
SUT MONOCRYL 0 CTX 36 (SUTURE) ×10
SUT PLAIN 0 NONE (SUTURE) IMPLANT
SUT PLAIN 2 0 XLH (SUTURE) ×6 IMPLANT
TOWEL OR 17X24 6PK STRL BLUE (TOWEL DISPOSABLE) ×3 IMPLANT
TRAY FOLEY BAG SILVER LF 14FR (SET/KITS/TRAYS/PACK) IMPLANT

## 2018-01-28 NOTE — Progress Notes (Signed)
Pt has made no change in her cx since 1900hours yesterday. She now has an elevated temp. Despite increasing the pitocin her MDUs continue to decline.Will proceed to the OR for c/s secondary to FTP. Discussed with pt.

## 2018-01-28 NOTE — Anesthesia Procedure Notes (Signed)
Epidural Patient location during procedure: OB Start time: 01/28/2018 1:39 AM  Staffing Anesthesiologist: Leonides GrillsEllender, Mollyann Halbert P, MD Performed: anesthesiologist   Preanesthetic Checklist Completed: patient identified, site marked, pre-op evaluation, timeout performed, IV checked, risks and benefits discussed and monitors and equipment checked  Epidural Patient position: sitting Prep: DuraPrep Patient monitoring: heart rate, cardiac monitor, continuous pulse ox and blood pressure Approach: midline Location: L3-L4 Injection technique: LOR air  Needle:  Needle type: Tuohy  Needle gauge: 17 G Needle length: 9 cm Needle insertion depth: 11 cm Catheter type: closed end flexible Catheter size: 19 Gauge Catheter at skin depth: 16 cm Test dose: negative and Other  Assessment Events: blood not aspirated, injection not painful, no injection resistance and negative IV test  Additional Notes Informed consent obtained prior to proceeding including risk of failure, 1% risk of PDPH, risk of minor discomfort and bruising. Discussed alternatives to epidural analgesia and patient desires to proceed.  Timeout performed pre-procedure verifying patient name, procedure, and platelet count.  Patient tolerated procedure well. Reason for block:procedure for pain

## 2018-01-28 NOTE — Progress Notes (Signed)
Pt comfortable with epidural.  BPs have been normal to low.  SVE now 6/C/-2 FHTs 120s, gstv, NST R, cat one tracing Toco q5 minutes   Second time cutting pitocin in half; MVUS were great and now have decreased a little but expect them to get adequate again with increasing dose.  Pt is making change.  Continue.

## 2018-01-28 NOTE — Anesthesia Preprocedure Evaluation (Signed)
Anesthesia Evaluation  Patient identified by MRN, date of birth, ID band Patient awake    Reviewed: Allergy & Precautions, H&P , NPO status , Patient's Chart, lab work & pertinent test results  History of Anesthesia Complications Negative for: history of anesthetic complications  Airway Mallampati: II  TM Distance: >3 FB Neck ROM: full    Dental no notable dental hx. (+) Teeth Intact   Pulmonary neg pulmonary ROS,    Pulmonary exam normal breath sounds clear to auscultation       Cardiovascular hypertension, Normal cardiovascular exam Rhythm:regular Rate:Normal     Neuro/Psych negative neurological ROS  negative psych ROS   GI/Hepatic negative GI ROS, Neg liver ROS,   Endo/Other  Morbid obesity  Renal/GU negative Renal ROS  negative genitourinary   Musculoskeletal   Abdominal (+) + obese,   Peds  Hematology negative hematology ROS (+)   Anesthesia Other Findings Super obese  Reproductive/Obstetrics (+) Pregnancy                             Anesthesia Physical Anesthesia Plan  ASA: IV  Anesthesia Plan: Epidural   Post-op Pain Management:    Induction:   PONV Risk Score and Plan:   Airway Management Planned:   Additional Equipment:   Intra-op Plan:   Post-operative Plan:   Informed Consent: I have reviewed the patients History and Physical, chart, labs and discussed the procedure including the risks, benefits and alternatives for the proposed anesthesia with the patient or authorized representative who has indicated his/her understanding and acceptance.     Plan Discussed with:   Anesthesia Plan Comments:         Anesthesia Quick Evaluation

## 2018-01-28 NOTE — Transfer of Care (Signed)
Immediate Anesthesia Transfer of Care Note  Patient: Anita Rojas  Procedure(s) Performed: CESAREAN SECTION (N/A )  Patient Location: PACU  Anesthesia Type:Epidural  Level of Consciousness: awake, alert  and oriented  Airway & Oxygen Therapy: Patient Spontanous Breathing  Post-op Assessment: Report given to RN and Post -op Vital signs reviewed and stable  Post vital signs: Reviewed and stable  Last Vitals:  Vitals:   01/28/18 1135 01/28/18 1302  BP: 126/77   Pulse: (!) 117 (!) 123  Resp:  16  Temp:  37.5 C  SpO2:  97%    Last Pain:  Vitals:   01/28/18 1043  TempSrc: Oral  PainSc: 0-No pain      Patients Stated Pain Goal: 1 (01/27/18 1824)  Complications: No apparent anesthesia complications

## 2018-01-29 LAB — CBC
HCT: 28.7 % — ABNORMAL LOW (ref 36.0–46.0)
Hemoglobin: 9.4 g/dL — ABNORMAL LOW (ref 12.0–15.0)
MCH: 28.1 pg (ref 26.0–34.0)
MCHC: 32.8 g/dL (ref 30.0–36.0)
MCV: 85.7 fL (ref 78.0–100.0)
PLATELETS: 151 10*3/uL (ref 150–400)
RBC: 3.35 MIL/uL — ABNORMAL LOW (ref 3.87–5.11)
RDW: 13.4 % (ref 11.5–15.5)
WBC: 15.2 10*3/uL — ABNORMAL HIGH (ref 4.0–10.5)

## 2018-01-29 MED ORDER — OXYCODONE-ACETAMINOPHEN 5-325 MG PO TABS
1.0000 | ORAL_TABLET | ORAL | Status: DC | PRN
  Administered 2018-01-29: 2 via ORAL
  Administered 2018-01-30 – 2018-01-31 (×6): 1 via ORAL
  Filled 2018-01-29: qty 1
  Filled 2018-01-29: qty 2
  Filled 2018-01-29 (×2): qty 1
  Filled 2018-01-29: qty 2
  Filled 2018-01-29 (×2): qty 1

## 2018-01-29 MED ORDER — SIMETHICONE 80 MG PO CHEW
80.0000 mg | CHEWABLE_TABLET | Freq: Four times a day (QID) | ORAL | Status: DC | PRN
  Administered 2018-01-29 – 2018-01-30 (×3): 80 mg via ORAL
  Filled 2018-01-29 (×2): qty 1

## 2018-01-29 NOTE — Progress Notes (Signed)
Late entry from morning rounds POD#1 Patient is eating, ambulating, voiding.  Pain was good in am, pt recently requested add'l pain medication, percocet was ordered.  Pt denied any other complaints.  Vitals:   01/29/18 0511 01/29/18 0900 01/29/18 1100 01/29/18 1903  BP: (!) 111/52 133/72  (!) 137/55  Pulse: 89 90 99 100  Resp: 18 18  18   Temp: 97.8 F (36.6 C) 97.7 F (36.5 C)  98 F (36.7 C)  TempSrc: Oral Oral  Oral  SpO2: 98% 95% 98%   Weight:      Height:       Inc: dressing with bloody drainage at lower aspect causing dressing to lift and impede seal of wound vac Ext: no CT  Lab Results  Component Value Date   WBC 15.2 (H) 01/29/2018   HGB 9.4 (L) 01/29/2018   HCT 28.7 (L) 01/29/2018   MCV 85.7 01/29/2018   PLT 151 01/29/2018    A/P Post op Day #1 s/p c/s for arrest of dilation and suspected chorioamnionitis BPs have been normal range since delivery Asked RN to change wound vac- this was done Pt had called nurse in requesting pain meds- please see note from RN from 20:26 I returned to patients room thereafter to determine the cause of her anger and frustration.  She said she was upset I told her she may stay til Monday and didn't understand why.  We discussed monitoring of BP and incision and making sure her pain was well controlled.  Advised that if all was stable she would not have to stay until Monday.  I apologized for the confusion and her not feeling well taken care of, at the conclusion of the conversation I asked if she was ok with me continuing to take care of her and she said that was fine, she felt better after our communication.  Philip AspenALLAHAN, Pati Thinnes

## 2018-01-29 NOTE — Lactation Note (Signed)
This note was copied from a baby's chart. Lactation Consultation Note  Patient Name: [name] ZOXWR'UToday's Date: 01/29/2018  Pecola LeisureBaby is now 2826 hours old.  Baby is not latching well and mom has been pumping every 3 hours.  She has obtained 9 mls x 2, 15 mls and 5 mls most recently.  Explained to mom that baby needs 15 mls every 3 hours.  Instructed to pump in 1 1/2 hours and if not enough volume is obtained to call out for formula.  Baby is currently sleeping.  Instructed to feed with cues and call out for latch assist prn.   Maternal Data    Feeding Feeding Type: Breast Milk  LATCH Score                   Interventions    Lactation Tools Discussed/Used     Consult Status      Huston FoleyMOULDEN, Renan Danese S 01/29/2018, 2:18 PM

## 2018-01-29 NOTE — Op Note (Signed)
NAME:  Anita Rojas, [name]              ACCOUNT NO.:  [id number]  MEDICAL RECORD NO.:  [id number]  LOCATION:                                 FACILITY:  PHYSICIAN:  Malva LimesMark Rodger Giangregorio, M.D.         DATE OF BIRTH:  DATE OF PROCEDURE:  01/28/2018 DATE OF DISCHARGE:                              OPERATIVE REPORT   PREOPERATIVE DIAGNOSES: 1. Intrauterine pregnancy at 3137 weeks' estimated gestational age. 2. Failure to progress.  PROCEDURE:  Primary low transverse cesarean section.  SURGEON:  Malva LimesMark Ima Hafner, M.D.  Threasa HeadsASSISTANChestine Spore:  Clark.  ANESTHESIA:  Epidural.  ANTIBIOTICS:  Ancef 3 g.  DRAINS:  Foley bedside drainage.  ESTIMATED BLOOD LOSS:  900 mL.  SPECIMENS:  None.  FINDINGS:  The patient had normal fallopian tubes and ovaries bilaterally.  Placenta appeared normal.  There was a nuchal cord x1.  DESCRIPTION OF PROCEDURE:  The patient was taken to the operating room, where her epidural anesthetic was reinjected.  She was then placed in a dorsal supine position with a left lateral tilt.  She was prepped and draped in the usual fashion for this procedure.  A Pfannenstiel incision was made.  This was carried down to the fascia.  The fascia was entered in the midline and extended laterally with blunt dissection.  The rectus muscles were dissected from the fascia.  Rectus muscles were divided in midline.  Parietal peritoneum was grasped, entered sharply, taken superiorly and inferiorly.  The bladder flap was then taken down sharply.  A low-transverse uterine incision was made in the midline with the Metzenbaum scissors and extended laterally with blunt dissection. Amniotic fluid was clear.  The infant was delivered in the vertex presentation with a vacuum extractor.  Nuchal cord was reduced.  The remaining infant was then delivered.  The cord was doubly clamped and cut and the infant handed to the awaiting NICU team.  Cord blood was not obtained.  Placenta was manually removed.  The uterus  was exteriorized. Uterine cavity was examined and wiped with a wet lap.  The incision of the uterus was closed using a single layer of 0 Monocryl suture in a running locking fashion.  The bladder flap was not closed.  The uterus was placed back in the abdominal cavity.  Hemostasis was checked and felt to be adequate.  The parietal peritoneum and rectus muscles were reapproximated in the midline using 2-0 Monocryl in a running fashion. The fascia was closed using 0 Monocryl suture in a running fashion.  Subcuticular tissue was irrigated, made hemostatic with the Bovie, and then closed with interrupted 2-0 plain gut sutures.  The skin was closed using 4-0 Rapide in a subcuticular fashion followed by Steri- Strips.  The patient was taken to the recovery room in stable condition. Instrument and lap count were correct x3.          ______________________________ Malva LimesMark Linford Quintela, M.D.     MA/MEDQ  D:  01/28/2018  T:  01/28/2018  Job:  [id number]

## 2018-01-29 NOTE — Progress Notes (Signed)
Rn went to go check and assess pt. Pt stated "can I get anything else im hurting". RN told patient there isnt anything else ordered and asked pain score. Pt stating pain is a 6. RN asked pt if she wanted me to contact the provider and see if anything else could be given. Pt stated "is that dr. Claiborne Billingsallahan" Rn told her that it would be who ever was on call. She said "fuck her I dont need her for anything". RN told patient that she would call the provider and she doesn't have to communicate with her at this time. She said no fuck her. PT support person/guest in room told her not to suffer because of her stupid doctor. RN told patient about pain control. Pt stated yeah you can call but I dont wanna see her again. RN told patient that she was not sure who was rounding in the morning but she would make a note in the computer. RN contacted AD Max SanePatti Phillips. Vear Clockhillips went into the room and offered Ice for incision pain. PT declined.   Dr. Dareen PianoAnderson notified at 2035 and voicemail provided.   Tierney Behl L Jacob Cicero, RN

## 2018-01-29 NOTE — Anesthesia Postprocedure Evaluation (Signed)
Anesthesia Post Note  Patient: Anita Rojas  Procedure(s) Performed: CESAREAN SECTION (N/A )     Patient location during evaluation: Mother Baby Anesthesia Type: Epidural Level of consciousness: awake and alert Pain management: pain level controlled Vital Signs Assessment: post-procedure vital signs reviewed and stable Respiratory status: spontaneous breathing, nonlabored ventilation and respiratory function stable Cardiovascular status: stable Postop Assessment: no headache, no backache and epidural receding Anesthetic complications: no    Last Vitals:  Vitals:   01/28/18 1800 01/29/18 0511  BP:  (!) 111/52  Pulse: (!) 115 89  Resp:  18  Temp:  36.6 C  SpO2: 95% 98%    Last Pain:  Vitals:   01/29/18 0511  TempSrc: Oral  PainSc:    Pain Goal: Patients Stated Pain Goal: 1 (01/27/18 1824)               Sherrie GeorgeStephanie W Wil Slape

## 2018-01-29 NOTE — Progress Notes (Signed)
At first 4-hr check (2110), new bleeding noted on patient's pico dressing; however, because her perineal pad had slipped, it appeared as if some of the bleeding had transferred from that.  Area marked. No new bleeding at 0111 check and patient has been up to bathroom, sitting in chair, breastfeeding.  Urine output is WNL (at least 40 ml/hr) and clear but orange in color.  Patient has been encouraged to drink (explained why that is at least as important as fluid from her IV).

## 2018-01-29 NOTE — Lactation Note (Addendum)
This note was copied from a baby's chart. Lactation Consultation Note Baby 13 hrs old. Having some difficulty latching. RN assisted in latching in cross cradle position.  Mom has tubular breast w/everted nipple at the bottom end of the nipple. Mom has some areola edema, reverse pressure demonstrated w/improvement.  Rolled cloth under breast for support.  Assisted in football position. Mom liked this position. Baby BF great w/good suck swallow coordination. Hand expressed 9 ml colostrum from Lt. Breast.  Discussed breast massage, newborn feeding habits, STS, I&O, and cluster feeding. Mom stated she prefers to pump and bottle feed colostrum. Mom stated she had gotten discouraged and not knowing if the baby was getting enough. Encouraged to assess breast before and after BF for transfer. Mom excited to feel the difference.  Mom shown how to use DEBP & how to disassemble, clean, & reassemble parts. Mom knows to pump q3h for 15-20 min. Answered a lot of questions mom had.  WH/LC brochure given w/resources, support groups and LC services.  Patient Name: Girl Trish Fountainaylor Bunning Today's Date: 01/29/2018 Reason for consult: Initial assessment   Maternal Data Has patient been taught Hand Expression?: Yes Does the patient have breastfeeding experience prior to this delivery?: No  Feeding Feeding Type: Breast Fed Length of feed: 45 min  LATCH Score Latch: Grasps breast easily, tongue down, lips flanged, rhythmical sucking.  Audible Swallowing: A few with stimulation  Type of Nipple: Everted at rest and after stimulation  Comfort (Breast/Nipple): Soft / non-tender  Hold (Positioning): Assistance needed to correctly position infant at breast and maintain latch.  LATCH Score: 8  Interventions Interventions: Breast feeding basics reviewed;Assisted with latch;Breast compression;Reverse pressure;Skin to skin;Adjust position;Breast massage;Support pillows;Hand express;Position options;DEBP;Expressed  milk  Lactation Tools Discussed/Used Tools: Pump Breast pump type: Double-Electric Breast Pump WIC Program: No Pump Review: Setup, frequency, and cleaning;Milk Storage Initiated by:: Peri JeffersonL. Sanam Marmo RN IBCLC Date initiated:: 01/29/18   Consult Status Consult Status: Follow-up Date: 01/30/18 Follow-up type: In-patient    Demarrius Guerrero, Diamond NickelLAURA G 01/29/2018, 3:08 AM

## 2018-01-30 MED ORDER — OXYCODONE-ACETAMINOPHEN 5-325 MG PO TABS
1.0000 | ORAL_TABLET | ORAL | 0 refills | Status: AC | PRN
Start: 2018-01-30 — End: ?

## 2018-01-30 NOTE — Progress Notes (Signed)
POD#2 Patient is eating, ambulating, voiding and passing flatus.  Pain control is good.  Bleeding is minimal.  Vitals:   01/29/18 0511 01/29/18 0900 01/29/18 1100 01/29/18 1903  BP: (!) 111/52 133/72  (!) 137/55  Pulse: 89 90 99 100  Resp: 18 18  18   Temp: 97.8 F (36.6 C) 97.7 F (36.5 C)  98 F (36.7 C)  TempSrc: Oral Oral  Oral  SpO2: 98% 95% 98%   Weight:      Height:       Inc: vac dressing again peeling away from skin, no bloody drainage Ext: +edema, no claf tenderness  Lab Results  Component Value Date   WBC 15.2 (H) 01/29/2018   HGB 9.4 (L) 01/29/2018   HCT 28.7 (L) 01/29/2018   MCV 85.7 01/29/2018   PLT 151 01/29/2018     A/P Post op day #2 s/p c/s Dressing to be reapplied and partner requesting to use this opportunity to be instructed again in application at home BPs normal Pt without complaints and doing much better today  Routine care.  Expect d/c 2/10.    Philip AspenALLAHAN, Aashka Salomone

## 2018-01-30 NOTE — Clinical Social Work Maternal (Signed)
  CLINICAL SOCIAL WORK MATERNAL/CHILD NOTE  Patient Details  Name: Anita Rojas MRN: [id number] Date of Birth: Dec 09, 1991  Date:  01/30/2018  Clinical Social Worker Initiating Note:  Trisha Mangle Date/Time: Initiated:  01/30/18/      Child's Name:  Anita Rojas   Biological Parents:  Mother, Father   Need for Interpreter:  None   Reason for Referral:  Other (Comment), Current Substance Use/Substance Use During Pregnancy (consulted for history of anxiety & depression)   Address:  [address], [address] [place] [address]    Phone number:  [phone] (home)     Additional phone number:   Household Members/Support Persons (HM/SP):   Household Member/Support Person 1   HM/SP Name Relationship DOB or Age  HM/SP -1 EVAN HAILES significant other    HM/SP -2        HM/SP -3        HM/SP -4        HM/SP -5        HM/SP -6        HM/SP -7        HM/SP -8          Natural Supports (not living in the home):  Immediate Family, Friends, Extended Family   Professional Supports: None   Employment: Unemployed   Type of Work:     Education:      Homebound arranged:    Museum/gallery curator Resources:  Medicaid   Other Resources:      Cultural/Religious Considerations Which May Impact Care:    Strengths:  Ability to meet basic needs , Home prepared for child    Psychotropic Medications:         Pediatrician:       Pediatrician List:   [place]      Pediatrician Fax Number:    Risk Factors/Current Problems:  None   Cognitive State:  Alert    Mood/Affect:  Animated, Bright    CSW Assessment: LCSW consulted for history of anxiety and depression.  Chart review reflected no concerns noted in MOB's prenatal visits although a review of MOB's labs through [facility] revealed a positive test for [facility] and Barbiturates on 12/25/17 so LCSW requested cord be sent out and LCSW will  follow.  LCSW met with MOB and FOB at bedside to assess for services.  MOB reported that this was her first baby and she was prepared with all equipment stating the baby will sleep in a crib.  MOB reported that she had much support from significant other and family.   MOB denied any history of anxiety or depression or any need for support in these areas.  MOB denied any drug use or current mental health support declining any current need for services.  RN reported no concerns. LCSW educated MOB and FOB on post partem and provided a checklist for review at discharge.  LCSW recommended MOB follow up with MD should she have any of the symptoms on the checklist.  LCSW provided a list of support groups available at [facility] for additional support.  MOB thankful for information.  No barriers to discharge.    CSW Plan/Description:  CSW Will Continue to Monitor Umbilical Cord Tissue Drug Screen Results and Make Report if Isac Sarna, LCSW 01/30/2018, 11:31 AM

## 2018-01-31 ENCOUNTER — Encounter (HOSPITAL_COMMUNITY): Payer: Self-pay | Admitting: *Deleted

## 2018-01-31 NOTE — Progress Notes (Signed)
Patient is eating, ambulating, voiding, passing gas. Pain control is good.  Vitals:   01/29/18 1903 01/30/18 1229 01/30/18 1819 01/31/18 0542  BP: (!) 137/55 (!) 116/56 131/75 126/72  Pulse: 100 92 91 93  Resp: 18 18 18 18   Temp: 98 F (36.7 C) 99.1 F (37.3 C) 97.9 F (36.6 C) 98.3 F (36.8 C)  TempSrc: Oral Oral Oral Oral  SpO2:  100%    Weight:      Height:        Dressing over incision intact, no drainage, no erythema noted Ext: no calf tenderness  Lab Results  Component Value Date   WBC 15.2 (H) 01/29/2018   HGB 9.4 (L) 01/29/2018   HCT 28.7 (L) 01/29/2018   MCV 85.7 01/29/2018   PLT 151 01/29/2018     A/P Post op day #3 Doing well, wound vac adherent, pt has materials to either change at home of bring to office to have changed.   BPs have been normal and pt without complaint. Discharge today  Routine care.   Philip AspenALLAHAN, [name]

## 2018-01-31 NOTE — Discharge Summary (Signed)
Obstetric Discharge Summary Reason for Admission: induction of labor Prenatal Procedures: Preeclampsia and ultrasound Intrapartum Procedures: cesarean: low cervical, transverse Postpartum Procedures: none Complications-Operative and Postpartum: none Hemoglobin  Date Value Ref Range Status  01/29/2018 9.4 (L) 12.0 - 15.0 g/dL Final   HCT  Date Value Ref Range Status  01/29/2018 28.7 (L) 36.0 - 46.0 % Final    Physical Exam:  General: alert and cooperative Lochia: appropriate Uterine Fundus: limited assessment d/t habitus Incision: healing well, no significant drainage, wound vac in place DVT Evaluation: No evidence of DVT seen on physical exam.  Discharge Diagnoses: Term Pregnancy-delivered  Discharge Information: Date: 01/31/2018 Activity: pelvic rest Diet: routine Medications: PNV, Ibuprofen and Percocet Condition: stable Instructions: refer to practice specific booklet Discharge to: home Follow-up Information    Carrington ClampHorvath, Michelle, MD Follow up in 2 day(s).   Specialty:  Obstetrics and Gynecology Contact information: [address] [place] [id number] [phone]           Newborn Data: Live born female  Birth Weight: 7 lb 1.2 oz (3210 g) APGAR: 8, 8  Newborn Delivery   Birth date/time:  01/28/2018 12:10:00 Delivery type:  C-Section, Low Transverse C-section categorization:  Primary     Home with mother.  Anita Rojas, Anita Rojas 01/31/2018, 8:30 PM

## 2018-02-17 ENCOUNTER — Encounter (HOSPITAL_COMMUNITY): Payer: Self-pay

## 2019-01-15 IMAGING — US US MFM OB DETAIL+14 WK
1 series · 14 of 28 positions shown · non-contrast
Comparison: none

[Series 1: us mfm ob detail+14 wk · 108 acquisitions, 14 frames shown]
[im 4/108]
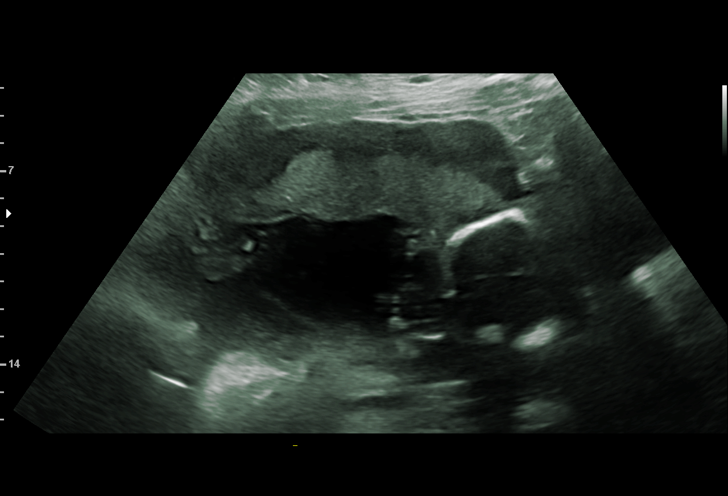
[im 12/108]
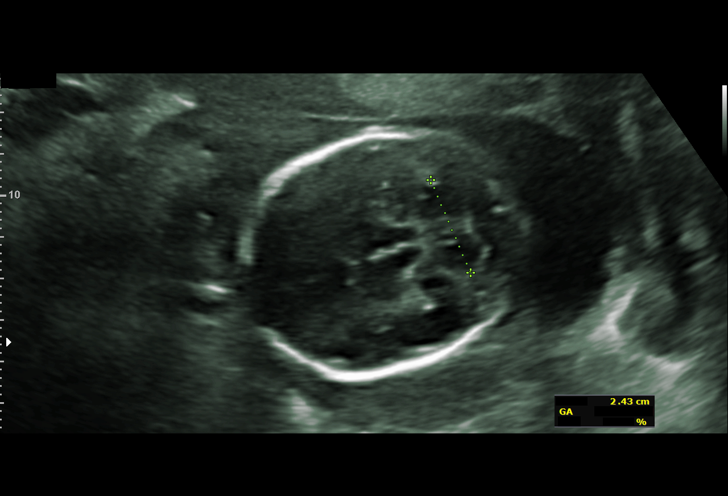
[im 20/108]
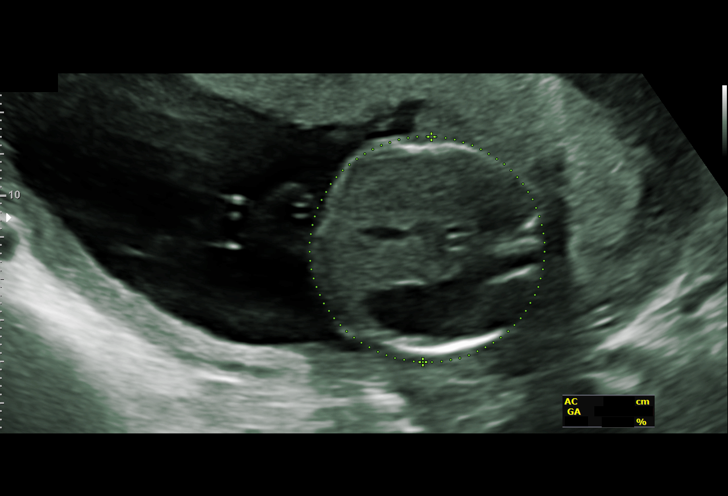
[im 28/108]
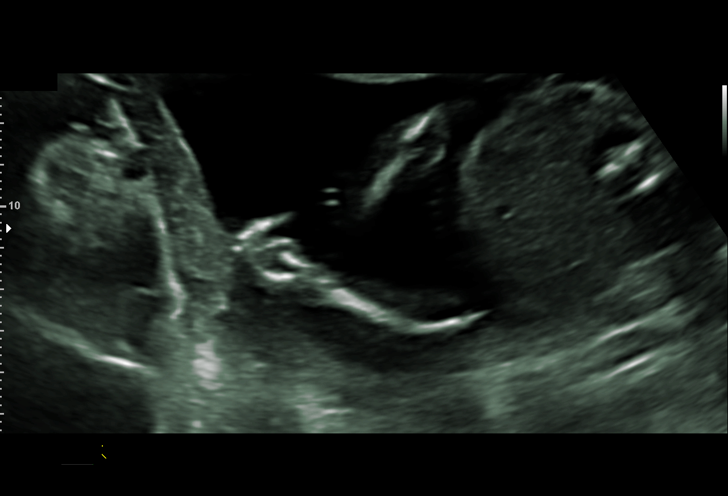
[im 36/108]
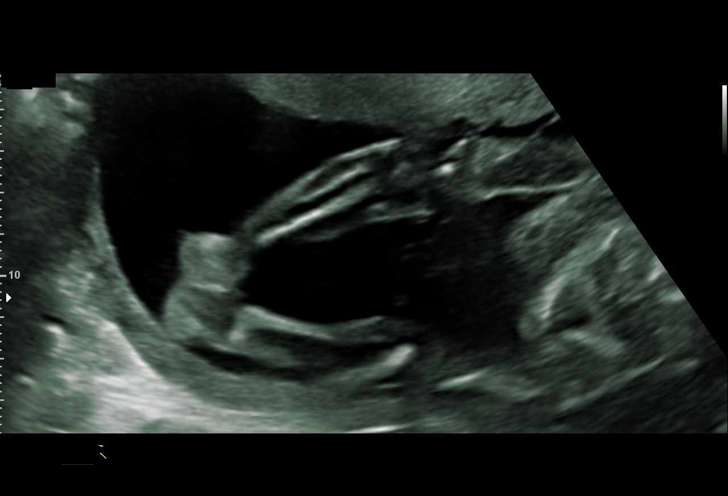
[im 44/108]
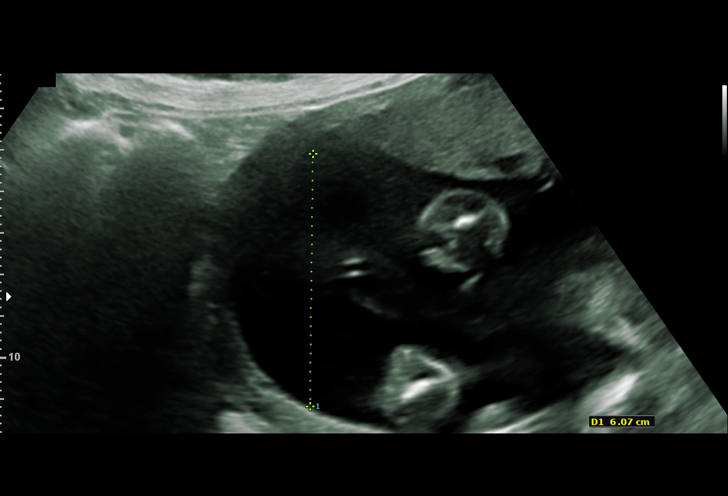
[im 52/108]
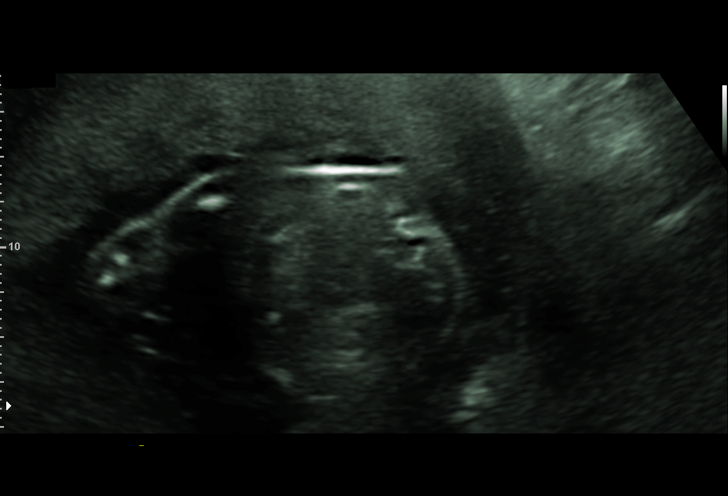
[im 60/108]
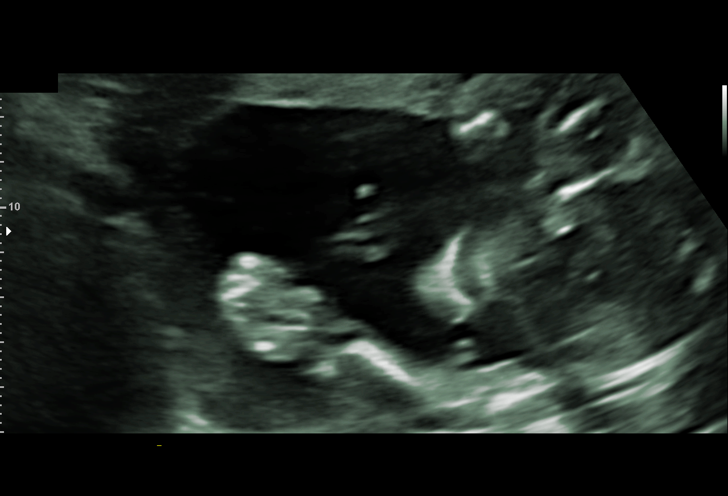
[im 68/108]
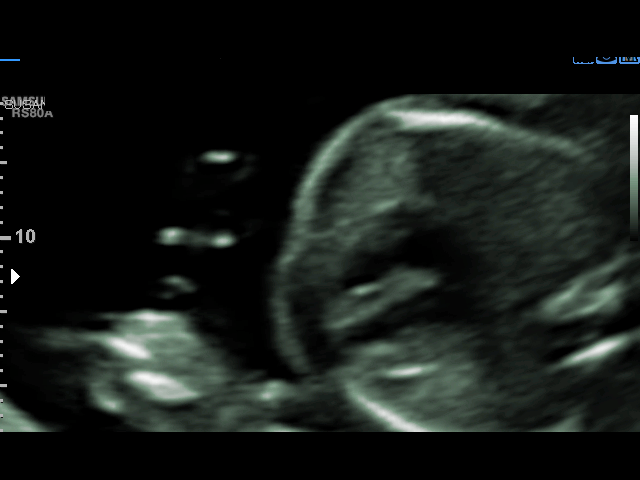
[im 76/108]
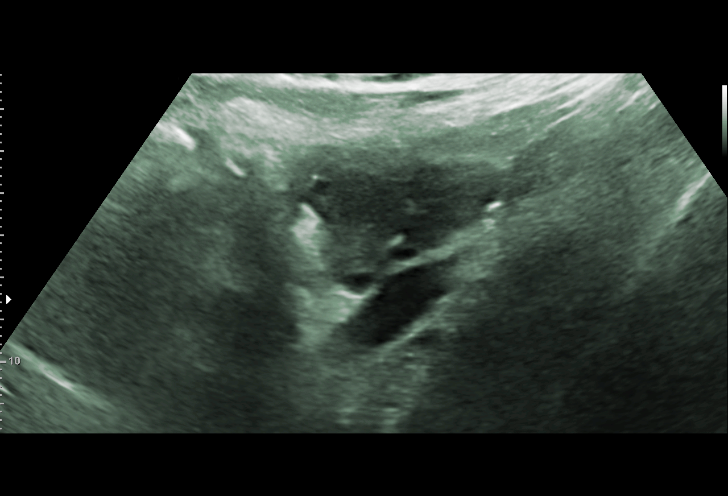
[im 84/108]
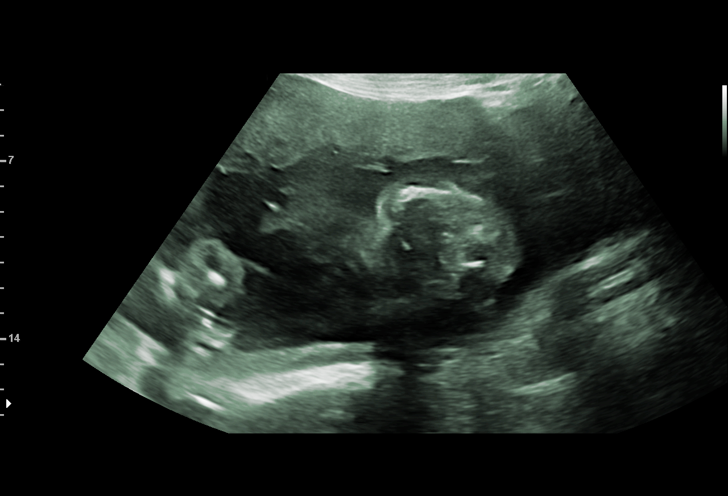
[im 92/108]
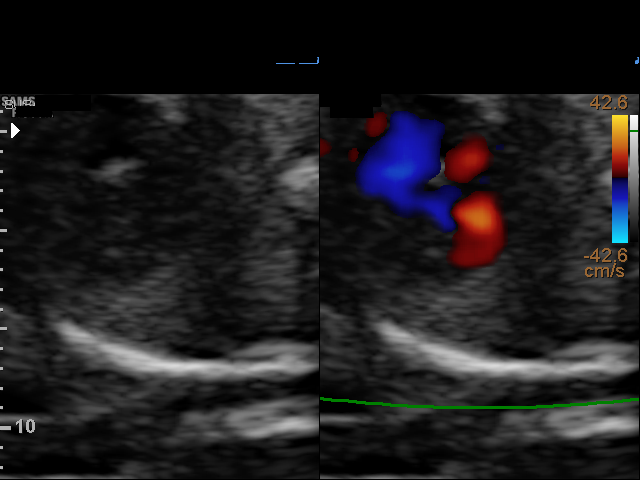
[im 100/108]
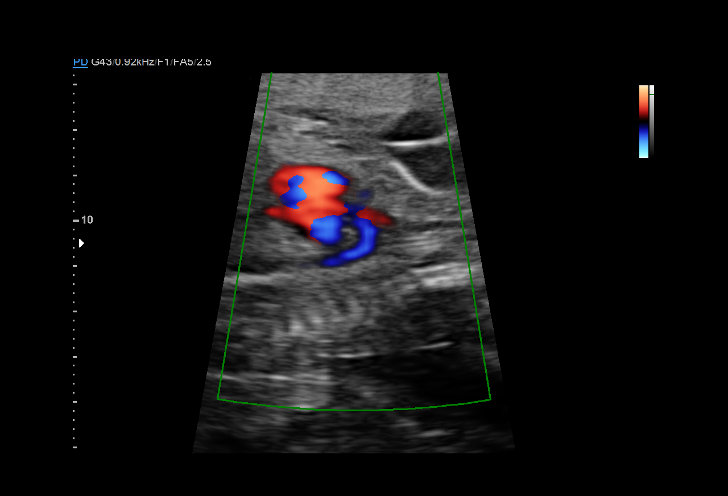
[im 108/108]
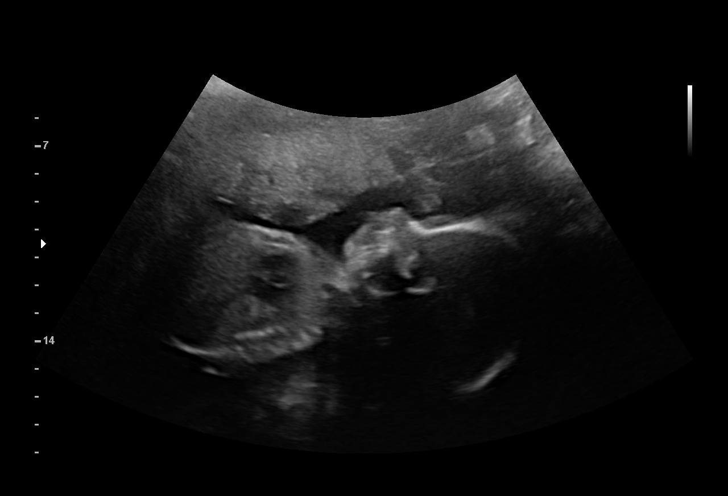

[14 of 28 positions shown; findings below may reference images not displayed]

1  XHAFISH RIKIC         [id number]      [id number]     [id number]
Indications

22 weeks gestation of pregnancy
Encounter for antenatal screening for
malformations
Encounter for antenatal screening for other
genetic defects - first cousin with "hole in
heart and fluid on brain", deceased.
Maternal morbid obesity 337 lbs
OB History

Blood Type:            Height:  5'9"   Weight (lb):  337       BMI:
Gravidity:    1
Fetal Evaluation

Num Of Fetuses:     1
Fetal Heart         148
Rate(bpm):
Cardiac Activity:   Observed
Presentation:       Cephalic
Placenta:           Anterior, above cervical os
P. Cord Insertion:  Visualized, central

Amniotic Fluid
AFI FV:      Subjectively within normal limits

Largest Pocket(cm)
6.1
Biometry
BPD:      55.9  mm     G. Age:  23w 0d         76  %    CI:         72.09  %    70 - 86
FL/HC:       18.3  %    18.4 -
HC:      209.5  mm     G. Age:  23w 0d         68  %    HC/AC:       1.19       1.06 -
AC:      175.6  mm     G. Age:  22w 3d         48  %    FL/BPD:      68.7  %    71 - 87
FL:       38.4  mm     G. Age:  22w 2d         40  %    FL/AC:       21.9  %    20 - 24
HUM:      38.7  mm     G. Age:  23w 5d         82  %
CER:      24.3  mm     G. Age:  22w 2d         50  %

CM:        7.2  mm

Est. FW:     508   gm     1 lb 2 oz     52  %
Gestational Age

LMP:           22w 2d        Date:  05/11/17                 EDD:    02/15/18
U/S Today:     22w 5d                                        EDD:    02/12/18
Best:          22w 2d     Det. By:  LMP  (05/11/17)          EDD:    02/15/18
Anatomy

Cranium:               Appears normal         Aortic Arch:            Appears normal
Cavum:                 Appears normal         Ductal Arch:            Appears normal
Ventricles:            Appears normal         Diaphragm:              Appears normal
Choroid Plexus:        Appears normal         Stomach:                Appears normal, left
sided
Cerebellum:            Appears normal         Abdomen:                Appears normal
Posterior Fossa:       Appears normal         Abdominal Wall:         Appears nml (cord
insert, abd wall)
Nuchal Fold:           Not applicable (>20    Cord Vessels:           Appears normal (3
wks GA)                                        vessel cord)
Face:                  Appears normal         Kidneys:                Appear normal
(orbits and profile)
Lips:                  Appears normal         Bladder:                Appears normal
Thoracic:              Appears normal         Spine:                  Appears normal
Heart:                 Appears normal         Upper Extremities:      Appears normal
(4CH, axis, and
situs)
RVOT:                  Appears normal         Lower Extremities:      Appears normal
LVOT:                  Appears normal

Other:  Nasal bone visualized. Heels visualized. Technically difficult due to
maternal habitus. Female gender.
Cervix Uterus Adnexa

Cervix
Length:            4.3  cm.
Normal appearance by transabdominal scan.

Left Ovary
Not visualized.

Right Ovary
Within normal limits.
Impression

SIUP at 22+2 weeks
Normal detailed fetal anatomy; heart anatomy was seen and
appeared normal
Normal amniotic fluid volume
Measurements consistent with LMP dating; EFW at the 52nd
%tile
Recommendations

Follow-up as clinically indicated
